# Patient Record
Sex: Female | Born: 1935 | Race: White | Hispanic: No | Smoking: Current every day smoker
Health system: Southern US, Community
[De-identification: ages and names within clinical notes are randomized; demographics above are authoritative.]

## PROBLEM LIST (undated history)

## (undated) DIAGNOSIS — C801 Malignant (primary) neoplasm, unspecified: Secondary | ICD-10-CM

## (undated) DIAGNOSIS — I10 Essential (primary) hypertension: Secondary | ICD-10-CM

## (undated) HISTORY — PX: ABDOMINAL SURGERY: SHX537

## (undated) HISTORY — PX: HERNIA REPAIR: SHX51

## (undated) HISTORY — PX: BREAST LUMPECTOMY: SHX2

## (undated) HISTORY — PX: ABDOMINAL HYSTERECTOMY: SHX81

---

## 2013-10-07 ENCOUNTER — Emergency Department (HOSPITAL_BASED_OUTPATIENT_CLINIC_OR_DEPARTMENT_OTHER): Payer: Medicare Other

## 2013-10-07 ENCOUNTER — Encounter (HOSPITAL_BASED_OUTPATIENT_CLINIC_OR_DEPARTMENT_OTHER): Payer: Self-pay | Admitting: Emergency Medicine

## 2013-10-07 ENCOUNTER — Emergency Department (HOSPITAL_BASED_OUTPATIENT_CLINIC_OR_DEPARTMENT_OTHER)
Admission: EM | Admit: 2013-10-07 | Discharge: 2013-10-07 | Disposition: A | Discharge status: Home / Self Care | Payer: Medicare Other | Attending: Emergency Medicine | Admitting: Emergency Medicine

## 2013-10-07 DIAGNOSIS — Y9389 Activity, other specified: Secondary | ICD-10-CM | POA: Insufficient documentation

## 2013-10-07 DIAGNOSIS — S9030XA Contusion of unspecified foot, initial encounter: Secondary | ICD-10-CM | POA: Insufficient documentation

## 2013-10-07 DIAGNOSIS — F17200 Nicotine dependence, unspecified, uncomplicated: Secondary | ICD-10-CM | POA: Insufficient documentation

## 2013-10-07 DIAGNOSIS — I10 Essential (primary) hypertension: Secondary | ICD-10-CM | POA: Insufficient documentation

## 2013-10-07 DIAGNOSIS — W108XXA Fall (on) (from) other stairs and steps, initial encounter: Secondary | ICD-10-CM | POA: Insufficient documentation

## 2013-10-07 DIAGNOSIS — Y9289 Other specified places as the place of occurrence of the external cause: Secondary | ICD-10-CM | POA: Insufficient documentation

## 2013-10-07 DIAGNOSIS — S9032XA Contusion of left foot, initial encounter: Secondary | ICD-10-CM

## 2013-10-07 DIAGNOSIS — Z7982 Long term (current) use of aspirin: Secondary | ICD-10-CM | POA: Insufficient documentation

## 2013-10-07 DIAGNOSIS — Z859 Personal history of malignant neoplasm, unspecified: Secondary | ICD-10-CM | POA: Insufficient documentation

## 2013-10-07 HISTORY — DX: Malignant (primary) neoplasm, unspecified: C80.1

## 2013-10-07 HISTORY — DX: Essential (primary) hypertension: I10

## 2013-10-07 MED ORDER — OXYCODONE-ACETAMINOPHEN 5-325 MG PO TABS: 1.0000 | ORAL_TABLET | Freq: Three times a day (TID) | ORAL | Status: DC | PRN

## 2013-10-07 NOTE — ED Notes (Signed)
Pt states she slipped down her stairs causing her left foot to bend back. Bruising noted to left dorsal area of foot along with a large area of swelling. Pain with ambulation. No other injury

## 2013-10-07 NOTE — ED Provider Notes (Signed)
CSN: [id number]     Arrival date & time 10/07/13  0118 History   First MD Initiated Contact with Patient 10/07/13 0150     Chief Complaint  Patient presents with  . Fall      Patient is a 80 y.o. female presenting with foot injury. The history is provided by the patient.  Foot Injury Location:  Foot Foot location:  L foot Pain details:    Quality:  Aching   Radiates to:  Does not radiate   Severity:  Mild   Onset quality:  Sudden   Timing:  Constant   Progression:  Worsening Chronicity:  New Relieved by:  Rest Worsened by:  Bearing weight Associated symptoms: no back pain and no neck pain   pt slipped on stairs just prior to arrival She reports during fall her left foot "bended back" No head injury No other injury reported She denies ankle pain  Past Medical History  Diagnosis Date  . Hypertension   . Cancer    Past Surgical History  Procedure Laterality Date  . Abdominal hysterectomy    . Hernia repair    . Breast lumpectomy     No family history on file. History  Substance Use Topics  . Smoking status: Current Every Day Smoker  . Smokeless tobacco: Not on file  . Alcohol Use: No   OB History   Grav Para Term Preterm Abortions TAB SAB Ect Mult Living                 Review of Systems  Musculoskeletal: Negative for back pain and neck pain.  Neurological: Negative for weakness.      Allergies  Review of patient's allergies indicates not on file.  Home Medications   Prior to Admission medications   Medication Sig Start Date End Date Taking? Authorizing Provider  aspirin EC 81 MG tablet Take 81 mg by mouth daily.   Yes Historical Provider, MD   BP 203/80  Pulse 86  Temp(Src) 98.4 F (36.9 C) (Oral)  Resp 20  Ht 4\' 11"  (1.499 m)  Wt 129 lb (58.514 kg)  BMI 26.04 kg/m2  SpO2 99% Physical Exam CONSTITUTIONAL: Well developed/well nourished HEAD: Normocephalic/atraumatic EYES: EOMI/PERRL ENMT: Mucous membranes moist NECK: supple no meningeal  signs SPINE:entire spine nontender CV: S1/S2 noted ABDOMEN: soft, nontender, no rebound or guarding NEURO: Pt is awake/alert, moves all extremitiesx4 EXTREMITIES: pulses normal, full ROM. Bruising/edema/tenderness noted to dorsal aspect of left foot.  No crepitus/laceration noted.  There is no left ankle tenderness.  No tenderness with palpation/range of motion of left hip/knee All other extremities/joints palpated/ranged and nontender SKIN: warm, color normal PSYCH: no abnormalities of mood noted  ED Course  Procedures 2:08 AM Pt declines pain meds at this time 2:42 AM Pt requests post op shoe Stable for d/c home  Imaging Review Dg Foot Complete Left  10/07/2013   CLINICAL DATA:  Fall, swelling.  EXAM: LEFT FOOT - COMPLETE 3+ VIEW  COMPARISON:  None.  FINDINGS: No acute fracture deformity. No dislocation. Mild first metatarsophalangeal osteoarthrosis. Os perineum. No destructive bony lesions. Dorsal mid to forefoot soft tissue swelling without subcutaneous gas or radiopaque foreign bodies.  IMPRESSION: Dorsal mid to forefoot soft tissue swelling without acute fracture deformity or dislocation.   Electronically Signed   By: Elon Alas   On: 10/07/2013 02:25     MDM   Final diagnoses:  Contusion of left foot, initial encounter    Nursing notes including past medical history  and social history reviewed and considered in documentation xrays reviewed and considered     Sharyon Cable, MD 10/07/13 [phone]

## 2013-10-07 NOTE — Discharge Instructions (Signed)

## 2013-10-19 ENCOUNTER — Encounter (HOSPITAL_BASED_OUTPATIENT_CLINIC_OR_DEPARTMENT_OTHER): Payer: Self-pay | Admitting: Emergency Medicine

## 2013-10-19 ENCOUNTER — Emergency Department (HOSPITAL_BASED_OUTPATIENT_CLINIC_OR_DEPARTMENT_OTHER): Payer: Medicare Other

## 2013-10-19 ENCOUNTER — Emergency Department (HOSPITAL_BASED_OUTPATIENT_CLINIC_OR_DEPARTMENT_OTHER)
Admission: EM | Admit: 2013-10-19 | Discharge: 2013-10-19 | Disposition: A | Discharge status: Home / Self Care | Payer: Medicare Other | Attending: Emergency Medicine | Admitting: Emergency Medicine

## 2013-10-19 DIAGNOSIS — S92242A Displaced fracture of medial cuneiform of left foot, initial encounter for closed fracture: Secondary | ICD-10-CM

## 2013-10-19 DIAGNOSIS — S99919A Unspecified injury of unspecified ankle, initial encounter: Secondary | ICD-10-CM

## 2013-10-19 DIAGNOSIS — F17200 Nicotine dependence, unspecified, uncomplicated: Secondary | ICD-10-CM | POA: Diagnosis not present

## 2013-10-19 DIAGNOSIS — Z859 Personal history of malignant neoplasm, unspecified: Secondary | ICD-10-CM | POA: Diagnosis not present

## 2013-10-19 DIAGNOSIS — Z7982 Long term (current) use of aspirin: Secondary | ICD-10-CM | POA: Insufficient documentation

## 2013-10-19 DIAGNOSIS — S8990XA Unspecified injury of unspecified lower leg, initial encounter: Secondary | ICD-10-CM | POA: Diagnosis present

## 2013-10-19 DIAGNOSIS — S92312D Displaced fracture of first metatarsal bone, left foot, subsequent encounter for fracture with routine healing: Secondary | ICD-10-CM

## 2013-10-19 DIAGNOSIS — IMO0002 Reserved for concepts with insufficient information to code with codable children: Secondary | ICD-10-CM | POA: Insufficient documentation

## 2013-10-19 DIAGNOSIS — Y9389 Activity, other specified: Secondary | ICD-10-CM | POA: Diagnosis not present

## 2013-10-19 DIAGNOSIS — Y929 Unspecified place or not applicable: Secondary | ICD-10-CM | POA: Insufficient documentation

## 2013-10-19 DIAGNOSIS — I10 Essential (primary) hypertension: Secondary | ICD-10-CM | POA: Diagnosis not present

## 2013-10-19 DIAGNOSIS — X500XXA Overexertion from strenuous movement or load, initial encounter: Secondary | ICD-10-CM | POA: Insufficient documentation

## 2013-10-19 DIAGNOSIS — S99929A Unspecified injury of unspecified foot, initial encounter: Secondary | ICD-10-CM

## 2013-10-19 DIAGNOSIS — IMO0001 Reserved for inherently not codable concepts without codable children: Secondary | ICD-10-CM | POA: Diagnosis not present

## 2013-10-19 MED ORDER — LISINOPRIL 10 MG PO TABS: 10.0000 mg | ORAL_TABLET | Freq: Every day | ORAL | Status: DC

## 2013-10-19 MED ORDER — OXYCODONE-ACETAMINOPHEN 5-325 MG PO TABS
1.0000 | ORAL_TABLET | Freq: Once | ORAL | Status: AC
  Administered 2013-10-19: 1 via ORAL
  Filled 2013-10-19: qty 1

## 2013-10-19 MED ORDER — OXYCODONE-ACETAMINOPHEN 5-325 MG PO TABS: 1.0000 | ORAL_TABLET | ORAL | Status: DC | PRN

## 2013-10-19 NOTE — Discharge Instructions (Signed)
Cuneiform Fracture, Simple Cuneiform Fracture (simple) is a break (fracture) of one of your cuneiform bones. This is one of the bones located in the middle of your foot. When fractures are small and not out of place, they may be treated conservatively. This means that only a cast is needed for treatment. At first, no walking may be allowed, but following a period of time, your caregiver may allow you to have a walking cast. DIAGNOSIS  The diagnosis of a fractured cuneiform is made by X-ray. X-rays may be required before and after the injury is put into a splint or cast. HOME CARE INSTRUCTIONS   Only take over-the-counter or prescription medicines for pain, discomfort or fever as directed by your caregiver.  If you have a splint held on with an elastic wrap and your foot or toes become numb or cold and blue, loosen the wrap and reapply more loosely. See your caregiver if there is no relief.  Use your injured foot as directed. Do not drive a vehicle until your caregiver specifically tells you it is safe to do so.  WARNING: See your caregiver as directed. It is important to keep all follow-up appointments in order to provide the best opportunity for healing and to avoid disability and chronic pain. SEEK IMMEDIATE MEDICAL CARE IF:   There is swelling or increasing pain in foot.  You begin to lose feeling in your foot or toes, or develop swelling of the foot or toes.  The foot or toes on the injured side are blue or cold.  You develop pain, which is not controlled by the medications. MAKE SURE YOU:   Understand these instructions.  Will watch your condition.  Will get help right away if you are not doing well or get worse. Document Released: 12/04/2001 Document Revised: 06/06/2011 Document Reviewed: 10/18/2007 [facility] Patient Information 2015 [place], [place]. This information is not intended to replace advice given to you by your health care provider. Make sure you discuss any questions you  have with your health care provider. Hypertension Hypertension, commonly called high blood pressure, is when the force of blood pumping through your arteries is too strong. Your arteries are the blood vessels that carry blood from your heart throughout your body. A blood pressure reading consists of a higher number over a lower number, such as 110/72. The higher number (systolic) is the pressure inside your arteries when your heart pumps. The lower number (diastolic) is the pressure inside your arteries when your heart relaxes. Ideally you want your blood pressure below 120/80. Hypertension forces your heart to work harder to pump blood. Your arteries may become narrow or stiff. Having hypertension puts you at risk for heart disease, stroke, and other problems.  RISK FACTORS Some risk factors for high blood pressure are controllable. Others are not.  Risk factors you cannot control include:   Race. You may be at higher risk if you are African American.  Age. Risk increases with age.  Gender. Men are at higher risk than women before age 54 years. After age 58, women are at higher risk than men. Risk factors you can control include:  Not getting enough exercise or physical activity.  Being overweight.  Getting too much fat, sugar, calories, or salt in your diet.  Drinking too much alcohol. SIGNS AND SYMPTOMS Hypertension does not usually cause signs or symptoms. Extremely high blood pressure (hypertensive crisis) may cause headache, anxiety, shortness of breath, and nosebleed. DIAGNOSIS  To check if you have hypertension, your  health care provider will measure your blood pressure while you are seated, with your arm held at the level of your heart. It should be measured at least twice using the same arm. Certain conditions can cause a difference in blood pressure between your right and left arms. A blood pressure reading that is higher than normal on one occasion does not mean that you need  treatment. If one blood pressure reading is high, ask your health care provider about having it checked again. TREATMENT  Treating high blood pressure includes making lifestyle changes and possibly taking medicine. Living a healthy lifestyle can help lower high blood pressure. You may need to change some of your habits. Lifestyle changes may include:  Following the DASH diet. This diet is high in fruits, vegetables, and whole grains. It is low in salt, red meat, and added sugars.  Getting at least 2 hours of brisk physical activity every week.  Losing weight if necessary.  Not smoking.  Limiting alcoholic beverages.  Learning ways to reduce stress. If lifestyle changes are not enough to get your blood pressure under control, your health care provider may prescribe medicine. You may need to take more than one. Work closely with your health care provider to understand the risks and benefits. HOME CARE INSTRUCTIONS  Have your blood pressure rechecked as directed by your health care provider.   Take medicines only as directed by your health care provider. Follow the directions carefully. Blood pressure medicines must be taken as prescribed. The medicine does not work as well when you skip doses. Skipping doses also puts you at risk for problems.   Do not smoke.   Monitor your blood pressure at home as directed by your health care provider. SEEK MEDICAL CARE IF:   You think you are having a reaction to medicines taken.  You have recurrent headaches or feel dizzy.  You have swelling in your ankles.  You have trouble with your vision. SEEK IMMEDIATE MEDICAL CARE IF:  You develop a severe headache or confusion.  You have unusual weakness, numbness, or feel faint.  You have severe chest or abdominal pain.  You vomit repeatedly.  You have trouble breathing. MAKE SURE YOU:   Understand these instructions.  Will watch your condition.  Will get help right away if you are  not doing well or get worse. Document Released: 03/14/2005 Document Revised: 07/29/2013 Document Reviewed: 01/04/2013 [facility] Patient Information 2015 [place], [place]. This information is not intended to replace advice given to you by your health care provider. Make sure you discuss any questions you have with your health care provider.   Emergency Department Resource Guide 1) Find a Doctor and Pay Out of Pocket Although you won't have to find out who is covered by your insurance plan, it is a good idea to ask around and get recommendations. You will then need to call the office and see if the doctor you have chosen will accept you as a new patient and what types of options they offer for patients who are self-pay. Some doctors offer discounts or will set up payment plans for their patients who do not have insurance, but you will need to ask so you aren't surprised when you get to your appointment.  2) Contact Your Local Health Department Not all health departments have doctors that can see patients for sick visits, but many do, so it is worth a call to see if yours does. If you don't know where your local health department  is, you can check in your phone book. The CDC also has a tool to help you locate your state's health department, and many state websites also have listings of all of their local health departments.  3) Find a [facility] If your illness is not likely to be very severe or complicated, you may want to try a walk in clinic. These are popping up all over the country in pharmacies, drugstores, and shopping centers. They're usually staffed by nurse practitioners or physician assistants that have been trained to treat common illnesses and complaints. They're usually fairly quick and inexpensive. However, if you have serious medical issues or chronic medical problems, these are probably not your best option.  No Primary Care Doctor: - Call Health Connect at  [phone] - they can help  you locate a primary care doctor that  accepts your insurance, provides certain services, etc. - Physician Referral Service- [phone]  Chronic Pain Problems: Organization         Address  Phone   Notes  [facility]  [phone] Patients need to be referred by their primary care doctor.   Medication Assistance: Organization         Address  Phone   Notes  [facility] Medication [facility] [address] [place], [place] [address] [phone] --Must be a resident of [facility] -- Must have NO insurance coverage whatsoever (no Medicaid/ Medicare, etc.) -- The pt. MUST have a primary care doctor that directs their care regularly and follows them in the community   MedAssist  [phone]   [employer]  [phone]    Agencies that provide inexpensive medical care: Organization         Address  Phone   Notes  [place]  [phone]   [facility]    [phone]   [facility] [place], [place] [address] [phone]   [place] [address], [place] [phone]   Planned Parenthood    [phone]   [facility]    [phone]   [place] and [place] [address], [place] Phone:  [phone], Fax:  [phone] Hours of Operation:  9 am - 6 pm, M-F.  Also accepts Medicaid/Medicare and self-pay.  [facility] for [place], [address], [place] Phone: [phone], Fax: [phone]. Hours of Operation:  8:30 am - 5:30 pm, M-F.  Also accepts Medicaid and self-pay.  [facility] [address] [place] Phone: [phone]   [place], [place], [place] [phone], Ext. [phone] Mondays & Thursdays: 7-9 AM.  First 15 patients are seen on a first come, first serve basis.    [place] Providers:  Organization         Address  Phone   Notes  [facility] [address], [place] [phone] Also accepts self-pay patients.  [place], [place]  [phone]   [place], [address], [place] [phone]   [facility] [address] [place] [phone]   Lucianne Lei [address], [place]   [phone] Only accepts [place] Access [place] patients after they have their  name applied to their card.   Self-Pay (no insurance) in [facility]:  Organization         Address  Phone   Notes  Sickle Cell Patients, [facility] [place] [phone]   [facility] [place] [phone]   [facility] [place]  [place], [place], [place] [phone]   [facility]/Dr. Osei-Bonsu  [address], [place] or [address], [place] [phone] Phone number for both [place] and [place] locations is the same.  [facility] [address], [place] [phone]   [facility] [address] [place] or [address] [phone]   [facility] [address], [place] [phone], phone; [phone], fax Sees patients 1st and 3rd Saturday of every month.  Must not qualify for public or private insurance (i.e. Medicaid, Medicare, [place] Health Choice, Veterans' Benefits)  Household income should be no more than 200% of the poverty level The clinic cannot treat you if you are pregnant or think you are pregnant  Sexually transmitted diseases are not treated at the clinic.    Dental Care: Organization         Address  Phone  Notes  [facility] Department of [facility] [place] [phone] Accepts children up to age 10 who are enrolled in [place] or [place]; pregnant women with a Medicaid card; and children who have applied for Medicaid or [place] Health Choice, but were declined, whose parents can pay a reduced fee at time of service.  [facility] Department of [facility]  [address], [place] [phone] Accepts children up to age 32 who are enrolled in [place] or [place]; pregnant women with a Medicaid card; and children who have applied for Medicaid or [place] Health Choice, but were declined, whose parents can pay a reduced fee at time of service.  [place] Adult Dental Access PROGRAM  [place] [phone] Patients are seen by appointment only. Walk-ins are not accepted. [place] will see patients 34 years of age and older. Monday - Tuesday (8am-5pm) Most Wednesdays (8:30-5pm) $30 per visit, cash only  [facility] Adult Dental Access PROGRAM  [address], [facility] [phone] Patients are seen by appointment only. Walk-ins are not accepted. [place] will see patients 22 years of age and older. One Wednesday Evening (Monthly: Volunteer Based).  $30 per visit, cash only  [place]  [phone] for adults; Children under age 76, call Graduate Pediatric Dentistry at [phone]. Children aged 53-14, please call [phone] to request a pediatric application.  Dental services are provided in all areas of dental care including fillings, crowns and bridges, complete and partial dentures, implants, gum treatment, root canals, and extractions. Preventive care is also provided. Treatment is provided to both adults and children. Patients are selected via a lottery and there is often a waiting list.   [facility] [address], [place]  [phone] [email or web address], [place], [place] [phone], Ext. [phone] Second and Fourth Thursday of each month, opens at 6:30 AM; Clinic ends at 9 AM.  Patients are seen on a  first-come first-served basis, and a limited number are seen during each clinic.   [facility]  [address] [place], [place] [phone]   Eligibility Requirements You must have lived in [place], [place], or [place] counties for at least the last three months.   You cannot be eligible for state or federal sponsored [employer], including [employer], [place], or [employer].   You generally cannot be eligible for healthcare insurance through your employer.    How to apply: Eligibility screenings are held every Tuesday and Wednesday afternoon from 1:00 pm until 4:00 pm. You do not need an appointment for the interview!  [facility] [address] [place], [place]   [place]  [place] Department  [place]  [phone]    Behavioral Health Resources in the Community: Intensive Outpatient Programs Organization         Address  Phone  Notes  [place]. [address] [place], [place] [phone]   [facility] [address] [place], [place]   ADS: Alcohol & Drug Svcs [address] [place], [place]   [place] [address]  [place], [place] or [phone]   Substance Abuse Resources Organization         Address  Phone  Notes  Alcohol and Drug Services  [phone]   [place]  [phone]   [facility] [place]   Chinita Pester  [phone]   Residential & Outpatient Substance Abuse Program  [phone]   Psychological Services Organization         Address  Phone  Notes  [facility] [place]  [place]  [phone]   [place] [address], [place] or [phone]    Mobile Crisis Teams Organization         Address  Phone  Notes  [facility]rapeutic Alternatives, Mobile Crisis Care Unit  [phone]   Assertive Psychotherapeutic Services  [address] [place], [place]   [place] Levels [address] [place] [phone]    Self-Help/Support Groups Organization         Address  Phone             Notes  [place]. of [place] - variety of support groups  Guy Call for more information  Narcotics Anonymous (NA), [facility] [address], [employer] [place]  2 meetings at this location   Special educational needs teacher         Address  Phone  Notes  [facility] Residential Treatment [place],    [place]  [phone]   [facility]  [address], [place] [address], [place], [place]   [place], [place] [phone] Admissions: 8am-3pm M-F  Incentives Substance [place] [address]    [place], [place] [phone]   [facility] [address] Jadene Pierini [place], [place]   [facility] [address]  [place], [place]   [facility] Programs - Intensive Outpatient [address], [place], [place]   [facility] ([place].) [place].,  [place], [place] or [phone]   [facility]idential Treatment Services (RTS) [address] [place], [place] Accepts  Medicaid  [facility] [address],  [place] [phone] Substance Abuse/Addiction Treatment   [facility] Organization         Address  Phone  Notes  [facility]  [phone]   Domenic Schwab, PhD [address] [place], [place]   [phone] or  [phone]   [place], [place] [phone]   [place] [address], [place], [place] [phone] Insurance/Medicaid/sponsorship through [facility] and Families [address] [place]                                    [place], [place] [phone] [place] [address], [place] [phone]    Dr. Adele Schilder  [phone]   [facility] Dept. 1) [address], [place] 2) [place] 3)  [address], [place] [phone]  [phone]   [place] [phone] or [phone] (After Hours)

## 2013-10-19 NOTE — ED Notes (Signed)
Pt c/o lt foot pain with swelling and bruising x2wks ago d/t a fall, states stepped wrong tonight and now pain/swelling/bruising again, states can't bare weight

## 2013-10-19 NOTE — ED Provider Notes (Signed)
CSN: [id number]     Arrival date & time 10/19/13  0024 History   First MD Initiated Contact with Patient 10/19/13 0033     Chief Complaint  Patient presents with  . Foot Injury     (Consider location/radiation/quality/duration/timing/severity/associated sxs/prior Treatment) HPI Patient with previous injury to her left foot 2 weeks ago after a fall. Has been ambulating. States she "stepped wrong" this evening with immediate pain to her left midfoot. She's had swelling at the site. She's been unable to and leg on the foot. She took Percocet with little relief. No other injury. Denies any head or neck trauma.  Patient states she has a previous history of hypertension but has not been on medication for several years. She does not remember the name of the medication she was on. She denies any headache or dizziness. No chest pain or shortness of breath. Past Medical History  Diagnosis Date  . Hypertension   . Cancer    Past Surgical History  Procedure Laterality Date  . Abdominal hysterectomy    . Hernia repair    . Breast lumpectomy     No family history on file. History  Substance Use Topics  . Smoking status: Current Every Day Smoker  . Smokeless tobacco: Not on file  . Alcohol Use: No   OB History   Grav Para Term Preterm Abortions TAB SAB Ect Mult Living                 Review of Systems  Constitutional: Negative for fever and chills.  Respiratory: Negative for shortness of breath.   Cardiovascular: Negative for chest pain.  Gastrointestinal: Negative for nausea, vomiting and abdominal pain.  Musculoskeletal: Positive for arthralgias and joint swelling. Negative for back pain, neck pain and neck stiffness.  Skin: Negative for rash.  Neurological: Negative for dizziness, weakness, light-headedness, numbness and headaches.  All other systems reviewed and are negative.     Allergies  Review of patient's allergies indicates no known allergies.  Home Medications    Prior to Admission medications   Medication Sig Start Date End Date Taking? Authorizing Provider  aspirin EC 81 MG tablet Take 81 mg by mouth daily.    Historical Provider, MD  oxyCODONE-acetaminophen (PERCOCET/ROXICET) 5-325 MG per tablet Take 1 tablet by mouth every 8 (eight) hours as needed for severe pain. 10/07/13   Sharyon Cable, MD   BP 218/80  Pulse 75  Temp(Src) 98.4 F (36.9 C) (Oral)  Resp 16  Ht 4\' 11"  (1.499 m)  Wt 129 lb (58.514 kg)  BMI 26.04 kg/m2  SpO2 96% Physical Exam  Nursing note and vitals reviewed. Constitutional: She is oriented to person, place, and time. She appears well-developed and well-nourished. No distress.  HENT:  Head: Normocephalic and atraumatic.  Eyes: EOM are normal. Pupils are equal, round, and reactive to light.  Neck: Normal range of motion. Neck supple.  No posterior midline cervical tenderness to palpation.  Cardiovascular: Normal rate and regular rhythm.   Pulmonary/Chest: Effort normal and breath sounds normal.  Abdominal: Soft. Bowel sounds are normal.  Musculoskeletal: Normal range of motion. She exhibits tenderness (patient with swelling to the distal second and third metatarsal of the left midfoot. Tender the site. There is some old bruising at the base of her toes.). She exhibits no edema.  No ankle pain over the lateral or medial malleoli. Proximal fibular tenderness. Good capillary refill.  Neurological: She is alert and oriented to person, place, and time.  Moves  all joints with good strength. Sensation grossly intact.  Skin: Skin is warm and dry. No rash noted. No erythema.  Psychiatric: She has a normal mood and affect. Her behavior is normal.    ED Course  Procedures (including critical care time) Labs Review Labs Reviewed - No data to display  Imaging Review No results found.   EKG Interpretation None      MDM   Final diagnoses:  None    Patient placed in cam boot and given crutches. She'll need to  followup with orthopedist in a week. She's been advised that she needs to find prior Dr. to manage her high blood pressure. Will start on low-dose lisinopril.    Julianne Rice, MD 10/19/13 [phone]

## 2015-06-01 ENCOUNTER — Encounter (HOSPITAL_BASED_OUTPATIENT_CLINIC_OR_DEPARTMENT_OTHER): Payer: Self-pay | Admitting: Emergency Medicine

## 2015-06-01 ENCOUNTER — Emergency Department (HOSPITAL_BASED_OUTPATIENT_CLINIC_OR_DEPARTMENT_OTHER): Payer: Medicare Other

## 2015-06-01 ENCOUNTER — Emergency Department (HOSPITAL_BASED_OUTPATIENT_CLINIC_OR_DEPARTMENT_OTHER)
Admission: EM | Admit: 2015-06-01 | Discharge: 2015-06-01 | Disposition: A | Discharge status: Home / Self Care | Payer: Medicare Other | Attending: Emergency Medicine | Admitting: Emergency Medicine

## 2015-06-01 DIAGNOSIS — I10 Essential (primary) hypertension: Secondary | ICD-10-CM | POA: Diagnosis not present

## 2015-06-01 DIAGNOSIS — N23 Unspecified renal colic: Secondary | ICD-10-CM

## 2015-06-01 DIAGNOSIS — Z79899 Other long term (current) drug therapy: Secondary | ICD-10-CM | POA: Diagnosis not present

## 2015-06-01 DIAGNOSIS — Z7982 Long term (current) use of aspirin: Secondary | ICD-10-CM | POA: Diagnosis not present

## 2015-06-01 DIAGNOSIS — Z859 Personal history of malignant neoplasm, unspecified: Secondary | ICD-10-CM | POA: Diagnosis not present

## 2015-06-01 DIAGNOSIS — F17200 Nicotine dependence, unspecified, uncomplicated: Secondary | ICD-10-CM | POA: Diagnosis not present

## 2015-06-01 DIAGNOSIS — R109 Unspecified abdominal pain: Secondary | ICD-10-CM | POA: Diagnosis present

## 2015-06-01 LAB — COMPREHENSIVE METABOLIC PANEL
ALBUMIN: 3.6 g/dL (ref 3.5–5.0)
ALK PHOS: 58 U/L (ref 38–126)
ALT: 12 U/L — AB (ref 14–54)
AST: 21 U/L (ref 15–41)
Anion gap: 8 (ref 5–15)
BILIRUBIN TOTAL: 0.5 mg/dL (ref 0.3–1.2)
BUN: 18 mg/dL (ref 6–20)
CALCIUM: 8.8 mg/dL — AB (ref 8.9–10.3)
CO2: 24 mmol/L (ref 22–32)
Chloride: 105 mmol/L (ref 101–111)
Creatinine, Ser: 1.06 mg/dL — ABNORMAL HIGH (ref 0.44–1.00)
GFR calc Af Amer: 56 mL/min — ABNORMAL LOW (ref >60)
GFR calc non Af Amer: 48 mL/min — ABNORMAL LOW (ref >60)
GLUCOSE: 129 mg/dL — AB (ref 65–99)
Potassium: 4 mmol/L (ref 3.5–5.1)
Sodium: 137 mmol/L (ref 135–145)
Total Protein: 8.2 g/dL — ABNORMAL HIGH (ref 6.5–8.1)

## 2015-06-01 LAB — CBC WITH DIFFERENTIAL/PLATELET
Basophils Absolute: 0 10*3/uL (ref 0.0–0.1)
Basophils Relative: 0 %
EOS ABS: 0 10*3/uL (ref 0.0–0.7)
Eosinophils Relative: 0 %
HCT: 38.6 % (ref 36.0–46.0)
HEMOGLOBIN: 13.1 g/dL (ref 12.0–15.0)
Lymphocytes Relative: 12 %
Lymphs Abs: 1.3 10*3/uL (ref 0.7–4.0)
MCH: 31.3 pg (ref 26.0–34.0)
MCHC: 33.9 g/dL (ref 30.0–36.0)
MCV: 92.1 fL (ref 78.0–100.0)
MONOS PCT: 4 %
Monocytes Absolute: 0.4 10*3/uL (ref 0.1–1.0)
NEUTROS ABS: 9.3 10*3/uL — AB (ref 1.7–7.7)
Neutrophils Relative %: 84 %
Platelets: 227 10*3/uL (ref 150–400)
RBC: 4.19 MIL/uL (ref 3.87–5.11)
RDW: 14 % (ref 11.5–15.5)
WBC: 11 10*3/uL — ABNORMAL HIGH (ref 4.0–10.5)

## 2015-06-01 LAB — URINALYSIS, ROUTINE W REFLEX MICROSCOPIC
BILIRUBIN URINE: NEGATIVE
Glucose, UA: NEGATIVE mg/dL
Hgb urine dipstick: NEGATIVE
Ketones, ur: NEGATIVE mg/dL
Leukocytes, UA: NEGATIVE
Nitrite: NEGATIVE
Protein, ur: 30 mg/dL — AB
SPECIFIC GRAVITY, URINE: 1.008 (ref 1.005–1.030)
pH: 7.5 (ref 5.0–8.0)

## 2015-06-01 LAB — URINE MICROSCOPIC-ADD ON

## 2015-06-01 LAB — LIPASE, BLOOD: Lipase: 42 U/L (ref 11–51)

## 2015-06-01 MED ORDER — KETOROLAC TROMETHAMINE 15 MG/ML IJ SOLN
15.0000 mg | Freq: Once | INTRAMUSCULAR | Status: AC
  Administered 2015-06-01: 15 mg via INTRAVENOUS
  Filled 2015-06-01: qty 1

## 2015-06-01 MED ORDER — ONDANSETRON HCL 4 MG PO TABS: 4.0000 mg | ORAL_TABLET | Freq: Four times a day (QID) | ORAL | Status: DC

## 2015-06-01 MED ORDER — ONDANSETRON HCL 4 MG/2ML IJ SOLN
4.0000 mg | Freq: Once | INTRAMUSCULAR | Status: AC
  Administered 2015-06-01: 4 mg via INTRAVENOUS
  Filled 2015-06-01: qty 2

## 2015-06-01 MED ORDER — MORPHINE SULFATE (PF) 4 MG/ML IV SOLN
4.0000 mg | Freq: Once | INTRAVENOUS | Status: AC
  Administered 2015-06-01: 4 mg via INTRAVENOUS
  Filled 2015-06-01: qty 1

## 2015-06-01 MED ORDER — SODIUM CHLORIDE 0.9 % IV SOLN: INTRAVENOUS | Status: DC

## 2015-06-01 MED ORDER — TAMSULOSIN HCL 0.4 MG PO CAPS: 0.4000 mg | ORAL_CAPSULE | Freq: Every day | ORAL | Status: DC

## 2015-06-01 MED ORDER — HYDROCODONE-ACETAMINOPHEN 5-325 MG PO TABS: 1.0000 | ORAL_TABLET | ORAL | Status: DC | PRN

## 2015-06-01 MED ORDER — SODIUM CHLORIDE 0.9 % IV BOLUS (SEPSIS)
1000.0000 mL | Freq: Once | INTRAVENOUS | Status: AC
  Administered 2015-06-01: 1000 mL via INTRAVENOUS

## 2015-06-01 MED FILL — ONDANSETRON HCL 4 MG TABLET: 4 | 4 days supply | Qty: 12 | Fill #0

## 2015-06-01 MED FILL — TAMSULOSIN HCL 0.4 MG CAP: 0.4 | 10 days supply | Qty: 10 | Fill #0

## 2015-06-01 MED FILL — HYDROCODON-APAP 5-325: 5-325 | 2 days supply | Qty: 10 | Fill #0

## 2015-06-01 NOTE — ED Notes (Signed)
MD at bedside. 

## 2015-06-01 NOTE — ED Notes (Addendum)
Patient transported to CT 

## 2015-06-01 NOTE — ED Provider Notes (Signed)
CSN: [id number]     Arrival date & time 06/01/15  0622 History   First MD Initiated Contact with Patient 06/01/15 [phone]     Chief Complaint  Patient presents with  . Flank Pain     (Consider location/radiation/quality/duration/timing/severity/associated sxs/prior Treatment) HPI Comments: Patient awoke around 1230 with L flank pain that has moved to lower abdomen.  Pain is constant but waxing and waning in severity. Associated with nausea and several episodes of vomiting, about 4 or 5 overnight.  No diarrhea.  Denies dysuria or hematuria.  No fever.  No history of kidney stones but feels that is what it is. No chest pain or SOB.  Patient is a 82 y.o. female presenting with flank pain. The history is provided by the patient.  Flank Pain Associated symptoms include abdominal pain. Pertinent negatives include no chest pain, no headaches and no shortness of breath.    Past Medical History  Diagnosis Date  . Hypertension   . Cancer [facility])    Past Surgical History  Procedure Laterality Date  . Abdominal hysterectomy    . Hernia repair    . Breast lumpectomy     No family history on file. Social History  Substance Use Topics  . Smoking status: Current Every Day Smoker  . Smokeless tobacco: None  . Alcohol Use: No   OB History    No data available     Review of Systems  Constitutional: Positive for activity change. Negative for fever and fatigue.  HENT: Negative for congestion and rhinorrhea.   Respiratory: Negative for cough, chest tightness and shortness of breath.   Cardiovascular: Negative for chest pain.  Gastrointestinal: Positive for nausea, vomiting and abdominal pain.  Genitourinary: Positive for dysuria, flank pain and difficulty urinating. Negative for hematuria, vaginal bleeding and vaginal discharge.  Musculoskeletal: Positive for back pain. Negative for myalgias and arthralgias.  Skin: Negative for rash.  Neurological: Negative for weakness, light-headedness and  headaches.  A complete 10 system review of systems was obtained and all systems are negative except as noted in the HPI and PMH.      Allergies  Review of patient's allergies indicates no known allergies.  Home Medications   Prior to Admission medications   Medication Sig Start Date End Date Taking? Authorizing Provider  aspirin EC 81 MG tablet Take 81 mg by mouth daily.    Historical Provider, MD  HYDROcodone-acetaminophen (NORCO/VICODIN) 5-325 MG tablet Take 1 tablet by mouth every 4 (four) hours as needed. 06/01/15   Ezequiel Essex, MD  lisinopril (PRINIVIL,ZESTRIL) 10 MG tablet Take 1 tablet (10 mg total) by mouth daily. 10/19/13   Julianne Rice, MD  ondansetron (ZOFRAN) 4 MG tablet Take 1 tablet (4 mg total) by mouth every 6 (six) hours. 06/01/15   Ezequiel Essex, MD  oxyCODONE-acetaminophen (PERCOCET) 5-325 MG per tablet Take 1 tablet by mouth every 4 (four) hours as needed for moderate pain or severe pain. 10/19/13   Julianne Rice, MD  oxyCODONE-acetaminophen (PERCOCET/ROXICET) 5-325 MG per tablet Take 1 tablet by mouth every 8 (eight) hours as needed for severe pain. 10/07/13   Ripley Fraise, MD  tamsulosin (FLOMAX) 0.4 MG CAPS capsule Take 1 capsule (0.4 mg total) by mouth daily. 06/01/15   Ezequiel Essex, MD   BP 210/74 mmHg  Pulse 72  Temp(Src) 97.5 F (36.4 C) (Oral)  Resp 18  Ht 4\' 11"  (1.499 m)  Wt 130 lb (58.968 kg)  BMI 26.24 kg/m2  SpO2 94% Physical Exam  Constitutional: She  is oriented to person, place, and time. She appears well-developed and well-nourished. She appears distressed.  uncomfortable  HENT:  Head: Normocephalic and atraumatic.  Mouth/Throat: Oropharynx is clear and moist. No oropharyngeal exudate.  Eyes: Conjunctivae and EOM are normal. Pupils are equal, round, and reactive to light.  Neck: Normal range of motion. Neck supple.  No meningismus.  Cardiovascular: Normal rate, regular rhythm, normal heart sounds and intact distal pulses.   No murmur  heard. Pulmonary/Chest: Effort normal and breath sounds normal. No respiratory distress.  Abdominal: Soft. There is tenderness. There is no rebound and no guarding.  TTP LUQ and LLQ. No guarding or rebound  Musculoskeletal: Normal range of motion. She exhibits no edema or tenderness.  No CVAT  Neurological: She is alert and oriented to person, place, and time. No cranial nerve deficit. She exhibits normal muscle tone. Coordination normal.  No ataxia on finger to nose bilaterally. No pronator drift. 5/5 strength throughout. CN 2-12 intact.Equal grip strength. Sensation intact.   Skin: Skin is warm.  Psychiatric: She has a normal mood and affect. Her behavior is normal.  Nursing note and vitals reviewed.   ED Course  Procedures (including critical care time) Labs Review Labs Reviewed  URINALYSIS, ROUTINE W REFLEX MICROSCOPIC (NOT AT [facility]) - Abnormal; Notable for the following:    Protein, ur 30 (*)    All other components within normal limits  CBC WITH DIFFERENTIAL/PLATELET - Abnormal; Notable for the following:    WBC 11.0 (*)    Neutro Abs 9.3 (*)    All other components within normal limits  URINE MICROSCOPIC-ADD ON - Abnormal; Notable for the following:    Squamous Epithelial / LPF 0-5 (*)    Bacteria, UA RARE (*)    All other components within normal limits  COMPREHENSIVE METABOLIC PANEL - Abnormal; Notable for the following:    Glucose, Bld 129 (*)    Creatinine, Ser 1.06 (*)    Calcium 8.8 (*)    Total Protein 8.2 (*)    ALT 12 (*)    GFR calc non Af Amer 48 (*)    GFR calc Af Amer 56 (*)    All other components within normal limits  LIPASE, BLOOD    Imaging Review Ct Renal Stone Study  06/01/2015  CLINICAL DATA:  Left-sided flank pain for several hours EXAM: CT ABDOMEN AND PELVIS WITHOUT CONTRAST TECHNIQUE: Multidetector CT imaging of the abdomen and pelvis was performed following the standard protocol without IV contrast. COMPARISON:  None. FINDINGS: Lung bases are  free of acute infiltrate or sizable effusion. The gallbladder has been surgically removed. The liver, spleen, right adrenal gland and pancreas are within normal limits with the exception of multiple calcified splenic granulomas. The left adrenal gland demonstrates a the 1.7 cm hypodensity likely representing an adrenal adenoma. Kidneys are well visualized bilaterally. Nonobstructing stone is noted in the upper pole of the right kidney measuring approximately 5 mm. A right renal cyst is also noted. The left kidney demonstrates significant hydronephrosis and hydroureter which extends to the level of the ureterovesical junction on the left. A 2-3 mm stone is noted best visualized on image number 59 of series 4. The bladder is partially distended. Diverticulosis without evidence of diverticulitis is seen. The appendix has been surgically removed as has the uterus. No free pelvic fluid is noted. Aortoiliac calcifications are seen. No acute bony abnormality is noted. IMPRESSION: Left UVJ stone with obstructive changes as described. 5 mm nonobstructing right renal stone. Changes  of prior granulomatous disease. Right renal cystic change. Likely left adrenal adenoma Electronically Signed   By: Inez Catalina M.D.   On: 06/01/2015 07:30   I have personally reviewed and evaluated these images and lab results as part of my medical decision-making.   EKG Interpretation None      MDM   Final diagnoses:  Ureteral colic  Essential hypertension  L flank pain with nausea and vomiting.  Concern for possible kidney stone.  Negative for infection. No hematuria. CT shows left UVJ stone 3 mm with some hydronephrosis.  Patient is a smoker with some desaturation into the upper 80s with sleeping. She has no wheezing. She states that she is "getting over a URI". She declines a chest x-ray. Lungs are clear.  She states her pain is well-controlled and she wishes to go home. She is informed of her elevated blood pressure and  states she's been diagnosed with blood pressure issues in the past but does not take medication for it. Instructed to follow-up with PCP as well as urology regarding kidney stone. Return precautions discussed.  Ezequiel Essex, MD 06/01/15 [phone]

## 2015-06-01 NOTE — Discharge Instructions (Signed)
Kidney Stones Take the pain medication as prescribed. Follow-up with the urologist. You need to establish care with a primary doctor regarding your blood pressure. Return to the ED if you develop new or worsening symptoms. Kidney stones (urolithiasis) are deposits that form inside your kidneys. The intense pain is caused by the stone moving through the urinary tract. When the stone moves, the ureter goes into spasm around the stone. The stone is usually passed in the urine.  CAUSES   A disorder that makes certain neck glands produce too much parathyroid hormone (primary hyperparathyroidism).  A buildup of uric acid crystals, similar to gout in your joints.  Narrowing (stricture) of the ureter.  A kidney obstruction present at birth (congenital obstruction).  Previous surgery on the kidney or ureters.  Numerous kidney infections. SYMPTOMS   Feeling sick to your stomach (nauseous).  Throwing up (vomiting).  Blood in the urine (hematuria).  Pain that usually spreads (radiates) to the groin.  Frequency or urgency of urination. DIAGNOSIS   Taking a history and physical exam.  Blood or urine tests.  CT scan.  Occasionally, an examination of the inside of the urinary bladder (cystoscopy) is performed. TREATMENT   Observation.  Increasing your fluid intake.  Extracorporeal shock wave lithotripsy--This is a noninvasive procedure that uses shock waves to break up kidney stones.  Surgery may be needed if you have severe pain or persistent obstruction. There are various surgical procedures. Most of the procedures are performed with the use of small instruments. Only small incisions are needed to accommodate these instruments, so recovery time is minimized. The size, location, and chemical composition are all important variables that will determine the proper choice of action for you. Talk to your health care provider to better understand your situation so that you will minimize the  risk of injury to yourself and your kidney.  HOME CARE INSTRUCTIONS   Drink enough water and fluids to keep your urine clear or pale yellow. This will help you to pass the stone or stone fragments.  Strain all urine through the provided strainer. Keep all particulate matter and stones for your health care provider to see. The stone causing the pain may be as small as a grain of salt. It is very important to use the strainer each and every time you pass your urine. The collection of your stone will allow your health care provider to analyze it and verify that a stone has actually passed. The stone analysis will often identify what you can do to reduce the incidence of recurrences.  Only take over-the-counter or prescription medicines for pain, discomfort, or fever as directed by your health care provider.  Keep all follow-up visits as told by your health care provider. This is important.  Get follow-up X-rays if required. The absence of pain does not always mean that the stone has passed. It may have only stopped moving. If the urine remains completely obstructed, it can cause loss of kidney function or even complete destruction of the kidney. It is your responsibility to make sure X-rays and follow-ups are completed. Ultrasounds of the kidney can show blockages and the status of the kidney. Ultrasounds are not associated with any radiation and can be performed easily in a matter of minutes.  Make changes to your daily diet as told by your health care provider. You may be told to:  Limit the amount of salt that you eat.  Eat 5 or more servings of fruits and vegetables each day.  Limit the amount of meat, poultry, fish, and eggs that you eat.  Collect a 24-hour urine sample as told by your health care provider.You may need to collect another urine sample every 6-12 months. SEEK MEDICAL CARE IF:  You experience pain that is progressive and unresponsive to any pain medicine you have been  prescribed. SEEK IMMEDIATE MEDICAL CARE IF:   Pain cannot be controlled with the prescribed medicine.  You have a fever or shaking chills.  The severity or intensity of pain increases over 18 hours and is not relieved by pain medicine.  You develop a new onset of abdominal pain.  You feel faint or pass out.  You are unable to urinate.   This information is not intended to replace advice given to you by your health care provider. Make sure you discuss any questions you have with your health care provider.   Document Released: 03/14/2005 Document Revised: 12/03/2014 Document Reviewed: 08/15/2012 Elsevier Interactive Patient Education 2016 [employer].   Hypertension Hypertension, commonly called high blood pressure, is when the force of blood pumping through your arteries is too strong. Your arteries are the blood vessels that carry blood from your heart throughout your body. A blood pressure reading consists of a higher number over a lower number, such as 110/72. The higher number (systolic) is the pressure inside your arteries when your heart pumps. The lower number (diastolic) is the pressure inside your arteries when your heart relaxes. Ideally you want your blood pressure below 120/80. Hypertension forces your heart to work harder to pump blood. Your arteries may become narrow or stiff. Having untreated or uncontrolled hypertension can cause heart attack, stroke, kidney disease, and other problems. RISK FACTORS Some risk factors for high blood pressure are controllable. Others are not.  Risk factors you cannot control include:   Race. You may be at higher risk if you are African American.  Age. Risk increases with age.  Gender. Men are at higher risk than women before age 8 years. After age 56, women are at higher risk than men. Risk factors you can control include:  Not getting enough exercise or physical activity.  Being overweight.  Getting too much fat, sugar,  calories, or salt in your diet.  Drinking too much alcohol. SIGNS AND SYMPTOMS Hypertension does not usually cause signs or symptoms. Extremely high blood pressure (hypertensive crisis) may cause headache, anxiety, shortness of breath, and nosebleed. DIAGNOSIS To check if you have hypertension, your health care provider will measure your blood pressure while you are seated, with your arm held at the level of your heart. It should be measured at least twice using the same arm. Certain conditions can cause a difference in blood pressure between your right and left arms. A blood pressure reading that is higher than normal on one occasion does not mean that you need treatment. If it is not clear whether you have high blood pressure, you may be asked to return on a different day to have your blood pressure checked again. Or, you may be asked to monitor your blood pressure at home for 1 or more weeks. TREATMENT Treating high blood pressure includes making lifestyle changes and possibly taking medicine. Living a healthy lifestyle can help lower high blood pressure. You may need to change some of your habits. Lifestyle changes may include:  Following the DASH diet. This diet is high in fruits, vegetables, and whole grains. It is low in salt, red meat, and added sugars.  Keep your sodium  intake below 2,300 mg per day.  Getting at least 30-45 minutes of aerobic exercise at least 4 times per week.  Losing weight if necessary.  Not smoking.  Limiting alcoholic beverages.  Learning ways to reduce stress. Your health care provider may prescribe medicine if lifestyle changes are not enough to get your blood pressure under control, and if one of the following is true:  You are 61-19 years of age and your systolic blood pressure is above 140.  You are 46 years of age or older, and your systolic blood pressure is above 150.  Your diastolic blood pressure is above 90.  You have diabetes, and your  systolic blood pressure is over [id number] or your diastolic blood pressure is over 90.  You have kidney disease and your blood pressure is above 140/90.  You have heart disease and your blood pressure is above 140/90. Your personal target blood pressure may vary depending on your medical conditions, your age, and other factors. HOME CARE INSTRUCTIONS  Have your blood pressure rechecked as directed by your health care provider.   Take medicines only as directed by your health care provider. Follow the directions carefully. Blood pressure medicines must be taken as prescribed. The medicine does not work as well when you skip doses. Skipping doses also puts you at risk for problems.  Do not smoke.   Monitor your blood pressure at home as directed by your health care provider. SEEK MEDICAL CARE IF:   You think you are having a reaction to medicines taken.  You have recurrent headaches or feel dizzy.  You have swelling in your ankles.  You have trouble with your vision. SEEK IMMEDIATE MEDICAL CARE IF:  You develop a severe headache or confusion.  You have unusual weakness, numbness, or feel faint.  You have severe chest or abdominal pain.  You vomit repeatedly.  You have trouble breathing. MAKE SURE YOU:   Understand these instructions.  Will watch your condition.  Will get help right away if you are not doing well or get worse.   This information is not intended to replace advice given to you by your health care provider. Make sure you discuss any questions you have with your health care provider.   Document Released: 03/14/2005 Document Revised: 07/29/2014 Document Reviewed: 01/04/2013 Elsevier Interactive Patient Education [employer].

## 2015-06-01 NOTE — ED Notes (Signed)
Pt states she feels she has a kidney stone. Reports L flank pain that woke her up, feeling the urge to urinate but unable to do so, and nausea and vomiting. Denies fever or other symptoms.

## 2017-02-02 ENCOUNTER — Other Ambulatory Visit: Payer: Self-pay

## 2017-02-02 ENCOUNTER — Emergency Department (HOSPITAL_BASED_OUTPATIENT_CLINIC_OR_DEPARTMENT_OTHER)
Admission: EM | Admit: 2017-02-02 | Discharge: 2017-02-02 | Discharge status: Against Medical Advice | Payer: Medicare Other | Attending: Emergency Medicine | Admitting: Emergency Medicine

## 2017-02-02 ENCOUNTER — Emergency Department (HOSPITAL_BASED_OUTPATIENT_CLINIC_OR_DEPARTMENT_OTHER): Payer: Medicare Other

## 2017-02-02 ENCOUNTER — Encounter (HOSPITAL_BASED_OUTPATIENT_CLINIC_OR_DEPARTMENT_OTHER): Payer: Self-pay | Admitting: *Deleted

## 2017-02-02 DIAGNOSIS — Z79899 Other long term (current) drug therapy: Secondary | ICD-10-CM | POA: Insufficient documentation

## 2017-02-02 DIAGNOSIS — K529 Noninfective gastroenteritis and colitis, unspecified: Secondary | ICD-10-CM | POA: Diagnosis not present

## 2017-02-02 DIAGNOSIS — F17200 Nicotine dependence, unspecified, uncomplicated: Secondary | ICD-10-CM | POA: Insufficient documentation

## 2017-02-02 DIAGNOSIS — Z7982 Long term (current) use of aspirin: Secondary | ICD-10-CM | POA: Diagnosis not present

## 2017-02-02 DIAGNOSIS — Z5320 Procedure and treatment not carried out because of patient's decision for unspecified reasons: Secondary | ICD-10-CM | POA: Diagnosis not present

## 2017-02-02 DIAGNOSIS — N289 Disorder of kidney and ureter, unspecified: Secondary | ICD-10-CM | POA: Diagnosis not present

## 2017-02-02 DIAGNOSIS — I10 Essential (primary) hypertension: Secondary | ICD-10-CM | POA: Diagnosis not present

## 2017-02-02 DIAGNOSIS — R1084 Generalized abdominal pain: Secondary | ICD-10-CM | POA: Diagnosis present

## 2017-02-02 LAB — URINALYSIS, ROUTINE W REFLEX MICROSCOPIC
Glucose, UA: NEGATIVE mg/dL
Hgb urine dipstick: NEGATIVE
KETONES UR: NEGATIVE mg/dL
NITRITE: NEGATIVE
PROTEIN: 100 mg/dL — AB
Specific Gravity, Urine: >1.03 — ABNORMAL HIGH (ref 1.005–1.030)
pH: 6 (ref 5.0–8.0)

## 2017-02-02 LAB — CBC WITH DIFFERENTIAL/PLATELET
BASOS ABS: 0 10*3/uL (ref 0.0–0.1)
BASOS PCT: 0 %
EOS ABS: 0 10*3/uL (ref 0.0–0.7)
Eosinophils Relative: 0 %
HCT: 39.9 % (ref 36.0–46.0)
HEMOGLOBIN: 13.6 g/dL (ref 12.0–15.0)
Lymphocytes Relative: 13 %
Lymphs Abs: 1.9 10*3/uL (ref 0.7–4.0)
MCH: 31.1 pg (ref 26.0–34.0)
MCHC: 34.1 g/dL (ref 30.0–36.0)
MCV: 91.3 fL (ref 78.0–100.0)
MONOS PCT: 6 %
Monocytes Absolute: 0.8 10*3/uL (ref 0.1–1.0)
NEUTROS PCT: 81 %
Neutro Abs: 12.2 10*3/uL — ABNORMAL HIGH (ref 1.7–7.7)
Platelets: 297 10*3/uL (ref 150–400)
RBC: 4.37 MIL/uL (ref 3.87–5.11)
RDW: 13.5 % (ref 11.5–15.5)
WBC: 15 10*3/uL — AB (ref 4.0–10.5)

## 2017-02-02 LAB — COMPREHENSIVE METABOLIC PANEL
ALBUMIN: 3.8 g/dL (ref 3.5–5.0)
ALK PHOS: 67 U/L (ref 38–126)
ALT: 17 U/L (ref 14–54)
ANION GAP: 10 (ref 5–15)
AST: 28 U/L (ref 15–41)
BUN: 36 mg/dL — AB (ref 6–20)
CALCIUM: 9 mg/dL (ref 8.9–10.3)
CO2: 19 mmol/L — AB (ref 22–32)
Chloride: 102 mmol/L (ref 101–111)
Creatinine, Ser: 1.63 mg/dL — ABNORMAL HIGH (ref 0.44–1.00)
GFR calc Af Amer: 33 mL/min — ABNORMAL LOW (ref >60)
GFR calc non Af Amer: 28 mL/min — ABNORMAL LOW (ref >60)
GLUCOSE: 123 mg/dL — AB (ref 65–99)
POTASSIUM: 4.1 mmol/L (ref 3.5–5.1)
SODIUM: 131 mmol/L — AB (ref 135–145)
Total Bilirubin: 0.8 mg/dL (ref 0.3–1.2)
Total Protein: 10.1 g/dL — ABNORMAL HIGH (ref 6.5–8.1)

## 2017-02-02 LAB — URINALYSIS, MICROSCOPIC (REFLEX)

## 2017-02-02 LAB — LIPASE, BLOOD: Lipase: 42 U/L (ref 11–51)

## 2017-02-02 MED ORDER — CIPROFLOXACIN HCL 500 MG PO TABS: 500.0000 mg | ORAL_TABLET | Freq: Two times a day (BID) | ORAL | 0 refills | Status: DC

## 2017-02-02 MED ORDER — ONDANSETRON HCL 4 MG/2ML IJ SOLN
4.0000 mg | Freq: Once | INTRAMUSCULAR | Status: AC
  Administered 2017-02-02: 4 mg via INTRAVENOUS
  Filled 2017-02-02: qty 2

## 2017-02-02 MED ORDER — SODIUM CHLORIDE 0.9 % IV BOLUS (SEPSIS)
500.0000 mL | Freq: Once | INTRAVENOUS | Status: AC
  Administered 2017-02-02: 500 mL via INTRAVENOUS

## 2017-02-02 MED ORDER — METRONIDAZOLE 500 MG PO TABS: 500.0000 mg | ORAL_TABLET | Freq: Three times a day (TID) | ORAL | 0 refills | Status: DC

## 2017-02-02 MED ORDER — FENTANYL CITRATE (PF) 100 MCG/2ML IJ SOLN
50.0000 ug | Freq: Once | INTRAMUSCULAR | Status: AC
  Administered 2017-02-02: 50 ug via INTRAVENOUS
  Filled 2017-02-02: qty 2

## 2017-02-02 NOTE — Discharge Instructions (Signed)
You are leaving [place].  Feel free to return for further treatment.  Follow-up as soon as possible your primary care doctor and you will need to follow-up with gastroenterology also.

## 2017-02-02 NOTE — ED Notes (Signed)
Patient transported to CT 

## 2017-02-02 NOTE — ED Triage Notes (Signed)
Pt c/o diffuse abd pain with nausea x 2 days

## 2017-02-02 NOTE — ED Provider Notes (Signed)
[facility] EMERGENCY DEPARTMENT Provider Note   CSN: [id number] Arrival date & time: 02/02/17  1453     History   Chief Complaint Chief Complaint  Patient presents with  . Abdominal Pain    HPI Wanda Gallegos is a 83 y.o. female.  HPI Patient presents with abdominal pain.  Has had nausea and some diarrhea.  It is in diffusely over abdomen and dull.  Began in the lower abdomen.  States she has had some coffee-ground stool when she wipes it is brown.  Pain has continued to worsen over the last couple days.  Previous history hysterectomy cholecystectomy and reportedly appendectomy.  Also states she had a hernia repair on her abdomen previously.  Abdomen have gotten a little larger.  No fevers.  Has had a mildly decreased appetite. Past Medical History:  Diagnosis Date  . Cancer ([place])   . Hypertension     There are no active problems to display for this patient.   Past Surgical History:  Procedure Laterality Date  . ABDOMINAL HYSTERECTOMY    . BREAST LUMPECTOMY    . HERNIA REPAIR      OB History    No data available       Home Medications    Prior to Admission medications   Medication Sig Start Date End Date Taking? Authorizing Provider  aspirin EC 81 MG tablet Take 81 mg by mouth daily.    [provider]  ciprofloxacin (CIPRO) 500 MG tablet Take 1 tablet (500 mg total) 2 (two) times daily by mouth. 02/02/17   Davonna Belling, MD  HYDROcodone-acetaminophen (NORCO/VICODIN) 5-325 MG tablet Take 1 tablet by mouth every 4 (four) hours as needed. 06/01/15   Rancour, Annie Main, MD  lisinopril (PRINIVIL,ZESTRIL) 10 MG tablet Take 1 tablet (10 mg total) by mouth daily. 10/19/13   Julianne Rice, MD  metroNIDAZOLE (FLAGYL) 500 MG tablet Take 1 tablet (500 mg total) 3 (three) times daily by mouth. 02/02/17   Davonna Belling, MD  ondansetron (ZOFRAN) 4 MG tablet Take 1 tablet (4 mg total) by mouth every 6 (six) hours. 06/01/15   Ezequiel Essex, MD     Family History History reviewed. No pertinent family history.  Social History Social History   Tobacco Use  . Smoking status: Current Every Day Smoker    Packs/day: 1.00  . Smokeless tobacco: Never Used  Substance Use Topics  . Alcohol use: No  . Drug use: No     Allergies   Patient has no known allergies.   Review of Systems Review of Systems  Constitutional: Negative for appetite change.  HENT: Negative for congestion.   Respiratory: Negative for shortness of breath.   Cardiovascular: Negative for chest pain.  Gastrointestinal: Positive for abdominal pain, constipation, diarrhea, nausea and vomiting.  Genitourinary: Negative for dysuria.  Musculoskeletal: Negative for back pain.  Skin: Negative for rash.  Neurological: Negative for seizures.  Psychiatric/Behavioral: Negative for confusion.     Physical Exam Updated Vital Signs BP 140/71 (BP Location: Left Arm)   Pulse 93   Temp 98.3 F (36.8 C) (Oral)   Resp 16   Ht 4\' 11"  (1.499 m)   Wt 59 kg (130 lb)   SpO2 90%   BMI 26.26 kg/m   Physical Exam  Constitutional: She appears well-developed.  HENT:  Head: Atraumatic.  Eyes: Pupils are equal, round, and reactive to light.  Cardiovascular:  Mild tachycardia  Abdominal: There is no hepatosplenomegaly. There is generalized tenderness. No hernia.  Patient with  diffuse abdominal pain that may be worse in the lower abdomen.  Some distention.  No hernias palpated.  Neurological: She is alert.  Skin: Skin is warm. Capillary refill takes less than 2 seconds.  Psychiatric: She has a normal mood and affect.     ED Treatments / Results  Labs (all labs ordered are listed, but only abnormal results are displayed) Labs Reviewed  URINALYSIS, ROUTINE W REFLEX MICROSCOPIC - Abnormal; Notable for the following components:      Result Value   APPearance CLOUDY (*)    Specific Gravity, Urine >1.030 (*)    Bilirubin Urine SMALL (*)    Protein, ur 100 (*)     Leukocytes, UA MODERATE (*)    All other components within normal limits  CBC WITH DIFFERENTIAL/PLATELET - Abnormal; Notable for the following components:   WBC 15.0 (*)    Neutro Abs 12.2 (*)    All other components within normal limits  COMPREHENSIVE METABOLIC PANEL - Abnormal; Notable for the following components:   Sodium 131 (*)    CO2 19 (*)    Glucose, Bld 123 (*)    BUN 36 (*)    Creatinine, Ser 1.63 (*)    Total Protein 10.1 (*)    GFR calc non Af Amer 28 (*)    GFR calc Af Amer 33 (*)    All other components within normal limits  URINALYSIS, MICROSCOPIC (REFLEX) - Abnormal; Notable for the following components:   Bacteria, UA FEW (*)    Squamous Epithelial / LPF 6-30 (*)    All other components within normal limits  LIPASE, BLOOD    EKG  EKG Interpretation None       Radiology Ct Abdomen Pelvis Wo Contrast  Result Date: 02/02/2017 CLINICAL DATA:  Abdominal pain EXAM: CT ABDOMEN AND PELVIS WITHOUT CONTRAST TECHNIQUE: Multidetector CT imaging of the abdomen and pelvis was performed following the standard protocol without IV contrast. COMPARISON:  06/01/2015 FINDINGS: Lower chest: Patchy ground-glass density in the lung bases. No pleural effusion or consolidation. Coronary vessel calcification. Mitral calcification. Trace pericardial effusion or thickening. Small hiatal hernia stable oval smooth nodule in the outer right breast possibly a lymph node. Multiple calcifications are tissue clips in the lower right breast. Hepatobiliary: No focal liver abnormality is seen. Status post cholecystectomy. No biliary dilatation. Pancreas: Unremarkable. No pancreatic ductal dilatation or surrounding inflammatory changes. Spleen: Multiple calcified granuloma Adrenals/Urinary Tract: Right adrenal gland is normal. Stable 19 mm low-density left adrenal mass consistent with adenoma. Multiple cysts bilaterally. No hydronephrosis. Punctate nonobstructing stone in the upper pole of the right  kidney and the lower pole of the left kidney. Bladder normal Stomach/Bowel: Stomach is nonenlarged. No dilated small bowel. Appendix not confidently visualized. Focal wall thickening of the descending colon and sigmoid colon with moderate surrounding inflammation. Sigmoid colon diverticula are present. There are no extraluminal gas collections. Vascular/Lymphatic: Moderate atherosclerosis of the aorta. No aneurysmal dilatation. No significantly enlarged lymph nodes Reproductive: Status post hysterectomy. No adnexal masses. Other: Negative for free air. Tiny free fluid in the pelvis. Fatty periumbilical hernia Musculoskeletal: Degenerative change. No acute or suspicious bone lesion IMPRESSION: 1. Focal wall thickening and surrounding moderate inflammation involving the descending and sigmoid colon. Sigmoid colon diverticula are present, but the most diseased segment of bowel at the distal descending/proximal sigmoid colon does not appear to have significant diverticular change, findings favored to represent colitis of infectious, inflammatory, or ischemic etiology, over diverticulitis. No extraluminal gas or abscess. Follow-up colonoscopy after  resolution of acute symptoms is recommended to exclude a mass as possible cause for colon wall thickening. 2. Stable 19 mm left adrenal gland adenoma 3. Punctate nonobstructing stones within the kidneys. Electronically Signed   By: Donavan Foil M.D.   On: 02/02/2017 20:47    Procedures Procedures (including critical care time)  Medications Ordered in ED Medications  sodium chloride 0.9 % bolus 500 mL (0 mLs Intravenous Stopped 02/02/17 1810)  fentaNYL (SUBLIMAZE) injection 50 mcg (50 mcg Intravenous Given 02/02/17 1743)  ondansetron (ZOFRAN) injection 4 mg (4 mg Intravenous Given 02/02/17 1742)  ondansetron (ZOFRAN) injection 4 mg (4 mg Intravenous Given 02/02/17 2042)     Initial Impression / Assessment and Plan / ED Course  I have reviewed the triage vital signs  and the nursing notes.  Pertinent labs & imaging results that were available during my care of the patient were reviewed by me and considered in my medical decision making (see chart for details).    Patient with colitis and abdominal pain.  Continued pain and increased creatinine.  However patient was not willing to stay for the admission that I recommended.  Left AMA.  Will treat with antibiotics patient will hopefully follow-up soon.  Final Clinical Impressions(s) / ED Diagnoses   Final diagnoses:  Colitis  Renal insufficiency    ED Discharge Orders        Ordered    metroNIDAZOLE (FLAGYL) 500 MG tablet  3 times daily     02/02/17 2154    ciprofloxacin (CIPRO) 500 MG tablet  2 times daily     02/02/17 2154       Davonna Belling, MD 02/03/17 0028

## 2017-02-07 ENCOUNTER — Encounter: Payer: Self-pay | Admitting: Gastroenterology

## 2017-04-03 ENCOUNTER — Ambulatory Visit: Payer: Medicare Other | Admitting: Gastroenterology

## 2018-03-22 ENCOUNTER — Other Ambulatory Visit: Payer: Self-pay

## 2018-03-22 ENCOUNTER — Emergency Department (HOSPITAL_BASED_OUTPATIENT_CLINIC_OR_DEPARTMENT_OTHER): Payer: Medicare Other

## 2018-03-22 ENCOUNTER — Emergency Department (HOSPITAL_BASED_OUTPATIENT_CLINIC_OR_DEPARTMENT_OTHER)
Admission: EM | Admit: 2018-03-22 | Discharge: 2018-03-22 | Disposition: A | Discharge status: Home / Self Care | Payer: Medicare Other | Attending: Emergency Medicine | Admitting: Emergency Medicine

## 2018-03-22 ENCOUNTER — Encounter (HOSPITAL_BASED_OUTPATIENT_CLINIC_OR_DEPARTMENT_OTHER): Payer: Self-pay | Admitting: Emergency Medicine

## 2018-03-22 DIAGNOSIS — Z7982 Long term (current) use of aspirin: Secondary | ICD-10-CM | POA: Insufficient documentation

## 2018-03-22 DIAGNOSIS — F17200 Nicotine dependence, unspecified, uncomplicated: Secondary | ICD-10-CM | POA: Diagnosis not present

## 2018-03-22 DIAGNOSIS — I10 Essential (primary) hypertension: Secondary | ICD-10-CM | POA: Insufficient documentation

## 2018-03-22 DIAGNOSIS — Z859 Personal history of malignant neoplasm, unspecified: Secondary | ICD-10-CM | POA: Insufficient documentation

## 2018-03-22 DIAGNOSIS — M25561 Pain in right knee: Secondary | ICD-10-CM | POA: Diagnosis present

## 2018-03-22 MED ORDER — DEXAMETHASONE 6 MG PO TABS
6.0000 mg | ORAL_TABLET | Freq: Once | ORAL | Status: AC
  Administered 2018-03-22: 6 mg via ORAL
  Filled 2018-03-22: qty 1

## 2018-03-22 MED ORDER — DICLOFENAC SODIUM 1 % TD GEL: 4.0000 g | Freq: Four times a day (QID) | TRANSDERMAL | 0 refills | Status: AC

## 2018-03-22 MED ORDER — APAP 325 MG PO TABS: 650.0000 mg | ORAL_TABLET | Freq: Four times a day (QID) | ORAL | 0 refills | Status: AC | PRN

## 2018-03-22 MED FILL — DICLOFENAC SODIUM 1 % GEL: 1 | 7 days supply | Qty: 100 | Fill #0

## 2018-03-22 MED FILL — ACETAMINOPHEN 325 MG TABLET: 325 | 13 days supply | Qty: 100 | Fill #0

## 2018-03-22 NOTE — Discharge Instructions (Signed)
Your x-ray showed evidence of CPPD which is also known as pseudogout.  I do not want to give you NSAIDs given your borderline elevated kidney function during your last visit in the hospital.  Please follow-up with your PCP for this.  You were given a dose of steroids in the department.  I am also prescribing you a topical NSAID called Voltaren, Tylenol and have given you a knee sleeve.  Please follow-up with your primary care provider or orthopedics in the next 1 week if this continues to give you difficulty.  Please see attached handout. If you develop worsening or new concerning symptoms you can return to the emergency department for re-evaluation.

## 2018-03-22 NOTE — ED Provider Notes (Signed)
[facility] EMERGENCY DEPARTMENT Provider Note   CSN: [id number] Arrival date & time: 03/22/18  5852     History   Chief Complaint Chief Complaint  Patient presents with  . Knee weakness    HPI Latona Service is a 84 y.o. female with history of hypertension presents emergency room today for right knee pain.  Patient reports that yesterday she was walking to her back door when she felt a large pop and her right knee.  She experienced pain on the lower, medial aspect of her knee that she describes as sharp.  She reports now when placing pressure on that she feels a sensation and feels like it is giving way.  She denies any current pain but is more concerned about the knee feeling like it is getting give way.  She notes some associated catching and clicking of the knee with walking.  Her current pain level is a 0/10.  Her symptoms are aggravated by walking.  She notes no relieving symptoms other than rest.  She denies history of similar symptoms in the past.  She is never had any difficulties with this knee and denies prior surgeries or trauma.  No falls.  She denies any lower extremity swelling, numbness, tingling or weakness. She has been able to walk without assistance since the event.   HPI  Past Medical History:  Diagnosis Date  . Cancer ([place])   . Hypertension     There are no active problems to display for this patient.   Past Surgical History:  Procedure Laterality Date  . ABDOMINAL HYSTERECTOMY    . ABDOMINAL SURGERY    . BREAST LUMPECTOMY    . HERNIA REPAIR       OB History   No obstetric history on file.      Home Medications    Prior to Admission medications   Medication Sig Start Date End Date Taking? Authorizing Provider  albuterol (PROVENTIL HFA;VENTOLIN HFA) 108 (90 Base) MCG/ACT inhaler Inhale into the lungs. 03/10/18  Yes [provider]  amLODipine (NORVASC) 10 MG tablet TAKE 1 TABLET(10 MG) BY MOUTH DAILY 03/19/18  Yes [provider]  aspirin EC 81 MG tablet Take 81 mg by mouth daily.    [provider]    Family History No family history on file.  Social History Social History   Tobacco Use  . Smoking status: Current Every Day Smoker    Packs/day: 1.00  . Smokeless tobacco: Never Used  Substance Use Topics  . Alcohol use: No  . Drug use: No     Allergies   Patient has no known allergies.   Review of Systems Review of Systems  Constitutional: Negative for fever.  Musculoskeletal: Positive for arthralgias. Negative for joint swelling.  Skin: Negative for color change and wound.  Neurological: Negative for numbness.     Physical Exam Updated Vital Signs BP (!) 143/72 (BP Location: Left Arm)   Pulse 84   Temp 97.7 F (36.5 C) (Oral)   Resp 16   Ht 4\' 10"  (1.473 m)   Wt 54.4 kg   SpO2 96%   BMI 25.08 kg/m   Physical Exam Vitals signs and nursing note reviewed.  Constitutional:      Appearance: She is well-developed.  HENT:     Head: Normocephalic and atraumatic.     Right Ear: External ear normal.     Left Ear: External ear normal.  Eyes:     General: No scleral icterus.  Right eye: No discharge.        Left eye: No discharge.     Conjunctiva/sclera: Conjunctivae normal.  Cardiovascular:     Pulses:          Dorsalis pedis pulses are 2+ on the right side.       Posterior tibial pulses are 2+ on the right side.     Comments: No lower extremity edema.  Calves are nontender.  Calves are symmetric in size bilaterally. Pulmonary:     Effort: Pulmonary effort is normal. No respiratory distress.  Musculoskeletal:     Right hip: She exhibits normal range of motion and no tenderness.     Right ankle: No tenderness. Achilles tendon normal.     Comments: Appearance normal. No obvious deformity. No skin swelling, erythema, heat, fluctuance or break of the skin. TTP over medial joint line. Active and passive flexion and extension intact without pain but noted  crepitus. Negative Lachman's test. Negative anterior/poster drawer bilaterally.  Equivocal McMurray's test. Negative ballottement test. No varus or valgus laxity or locking. No TTP of hips or ankles. Compartments soft. Neurovascularly intact distally to site of injury.  Intact straight leg raise   Skin:    Coloration: Skin is not pale.     Findings: No erythema.  Neurological:     Mental Status: She is alert.     Sensory: Sensation is intact.     Motor: Motor function is intact.      ED Treatments / Results  Labs (all labs ordered are listed, but only abnormal results are displayed) Labs Reviewed - No data to display  EKG None  Radiology No results found.  Procedures Procedures (including critical care time)  Medications Ordered in ED Medications - No data to display   Initial Impression / Assessment and Plan / ED Course  I have reviewed the triage vital signs and the nursing notes.  Pertinent labs & imaging results that were available during my care of the patient were reviewed by me and considered in my medical decision making (see chart for details).      84 y.o. female with right knee pain after feeling a pop yesterday not feeling like her knee is giving way when walking and feeling a clicking/popping sensation.  Patient denies trauma. Normal ROM. No deformity.  Equivocal McMurray's.  Intact straight leg raise.  No concern for quadricep rupture or patellar fracture.  Patient denies dislocation/relocation.  No concern for arterial injury.  Do not suspect DVT.  She is neurovascular intact distally.  Compartments are soft.  Will obtain screening x-ray to evaluate.  Suspect will treat with conservative therapies, brace, and follow-up with orthopedics versus PCP. I do not suspect septic joint given hx, no fever, no joint swelling, erythema and normal ROM.  X-ray reviewed.  There is noted to be mild osteoarthritis. Possible loose body also seen in findings, but unsure  relevance to pain.  There is also noted to be CPPD.  On chart review patient cannot have NSAIDs that she had an elevated last creatinine during hospital stay.  No history of prior ulcers, or diabetes.  Will give dose of Decadron here.  Will treat with Tylenol, Voltaren, knee sleeve and follow-up with PCP versus orthopedics. Patient and family in agreement with this. Return precautions discussed.  Patient appears safe for discharge.  Final Clinical Impressions(s) / ED Diagnoses   Final diagnoses:  Acute pain of right knee    ED Discharge Orders  Ordered    diclofenac sodium (VOLTAREN) 1 % GEL  4 times daily     03/22/18 1122    acetaminophen 325 MG tablet  Every 6 hours PRN     03/22/18 1122           Lorelle Gibbs 03/22/18 [place], [place], DO 03/22/18 1555

## 2018-03-22 NOTE — ED Notes (Signed)
ED Provider at bedside. 

## 2018-03-22 NOTE — ED Triage Notes (Signed)
R knee pain since yesterday. She states she was walking and felt a pop. Pt d/c last week following surgery for SBO

## 2019-04-14 IMAGING — CR DG KNEE COMPLETE 4+V*R*
4 series · 4 of 4 positions shown · non-contrast
Comparison: MRI right knee 05/02/2017. Plain films right knee
04/13/2017.

CLINICAL DATA: The patient felt a pop in the right knee yesterday
while walking. Pain.

EXAM:
RIGHT KNEE - COMPLETE 4+ VIEW

[t knee ap right]
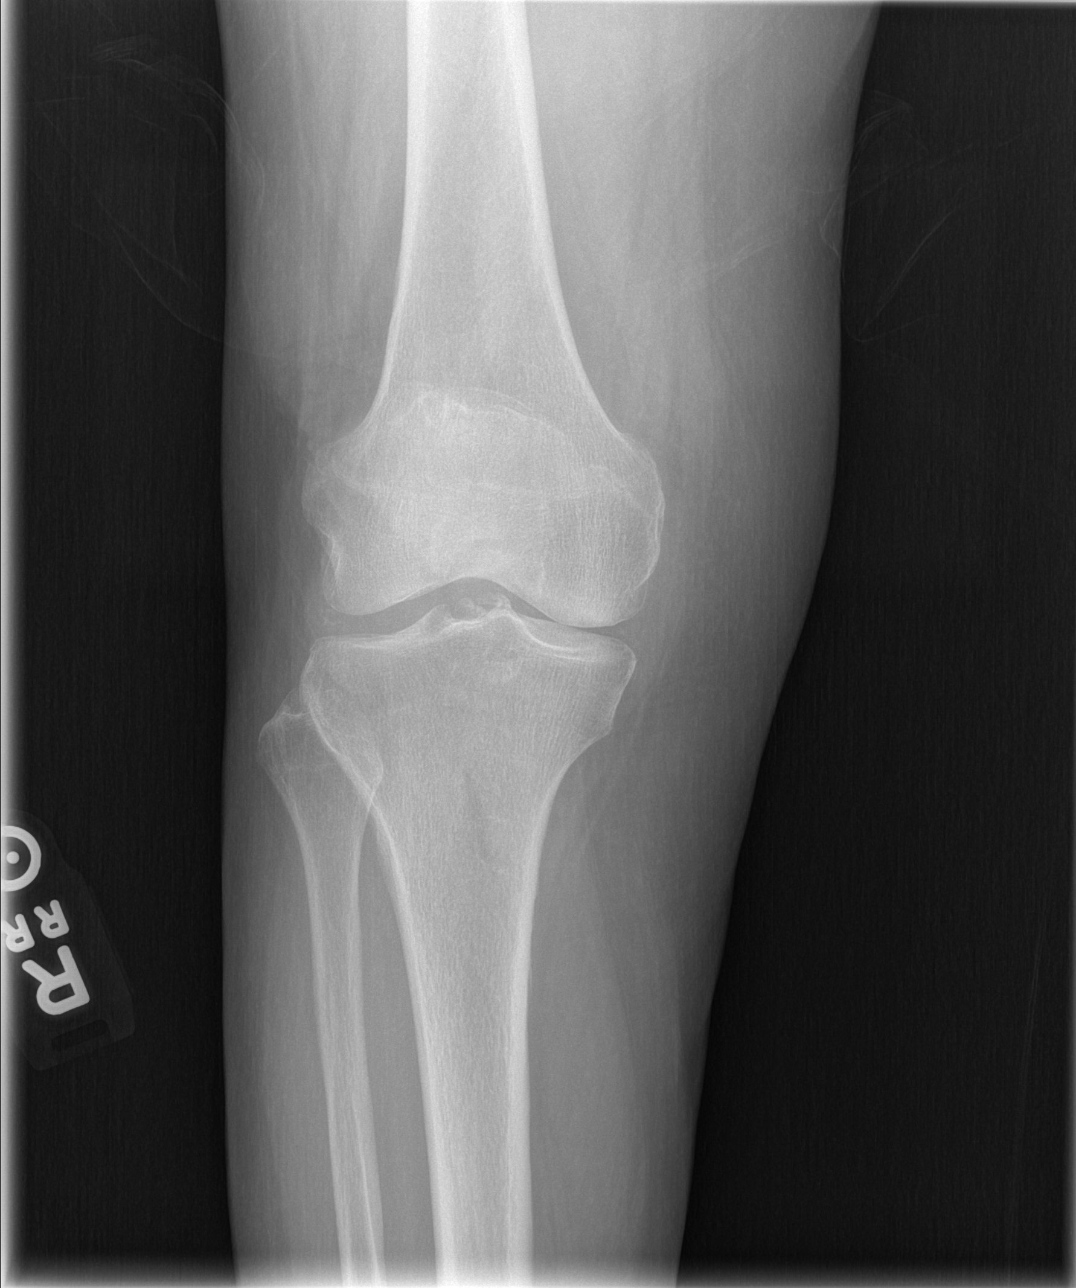

[t knee oblique right (1 of 2)]
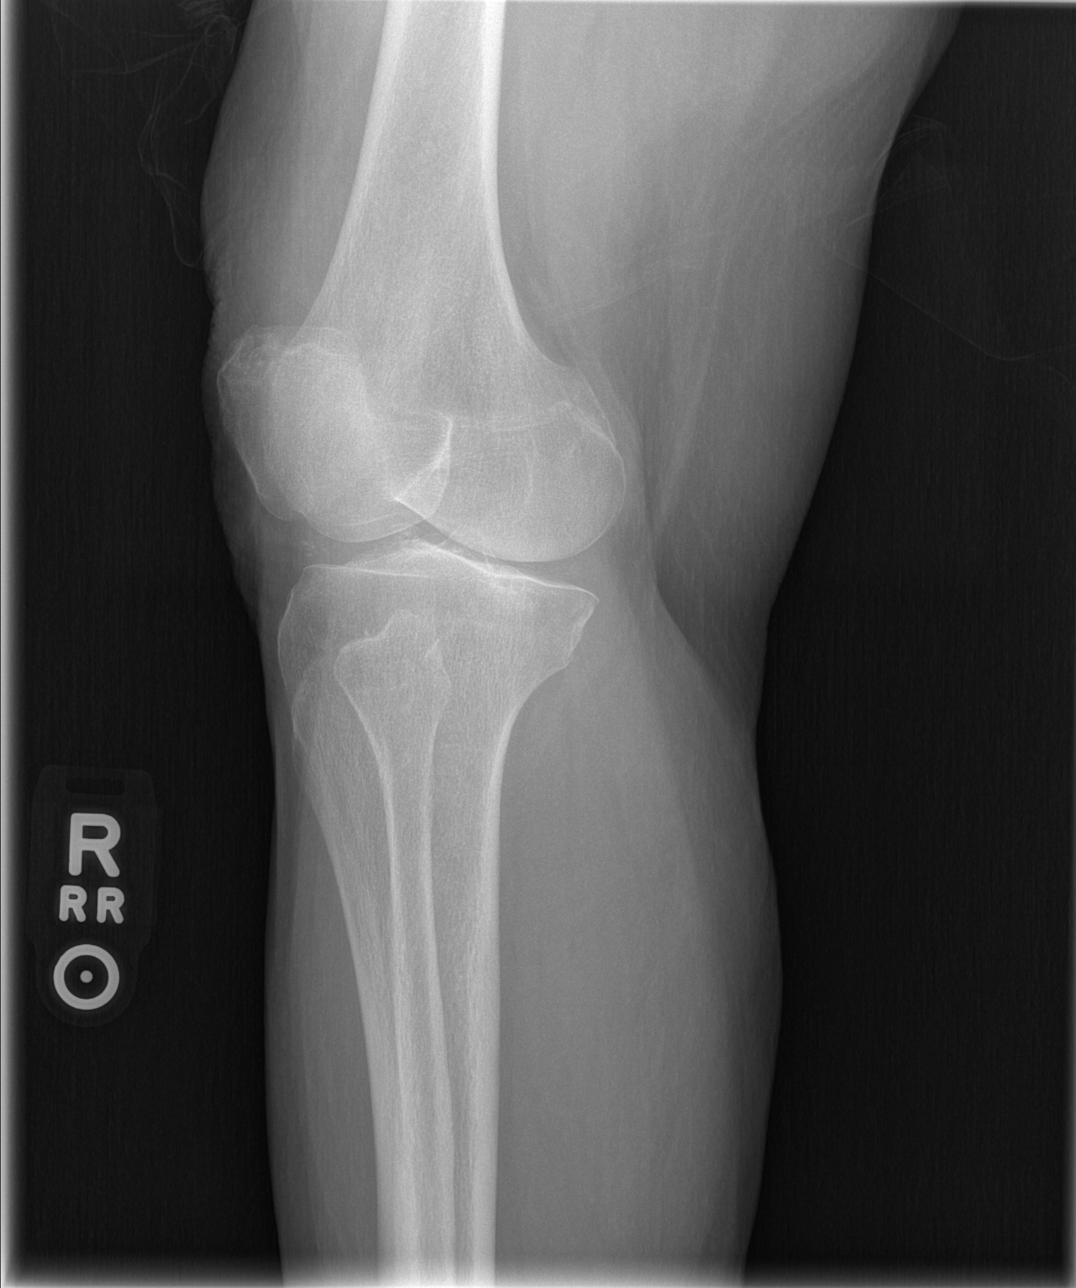

[t knee oblique right (2 of 2)]
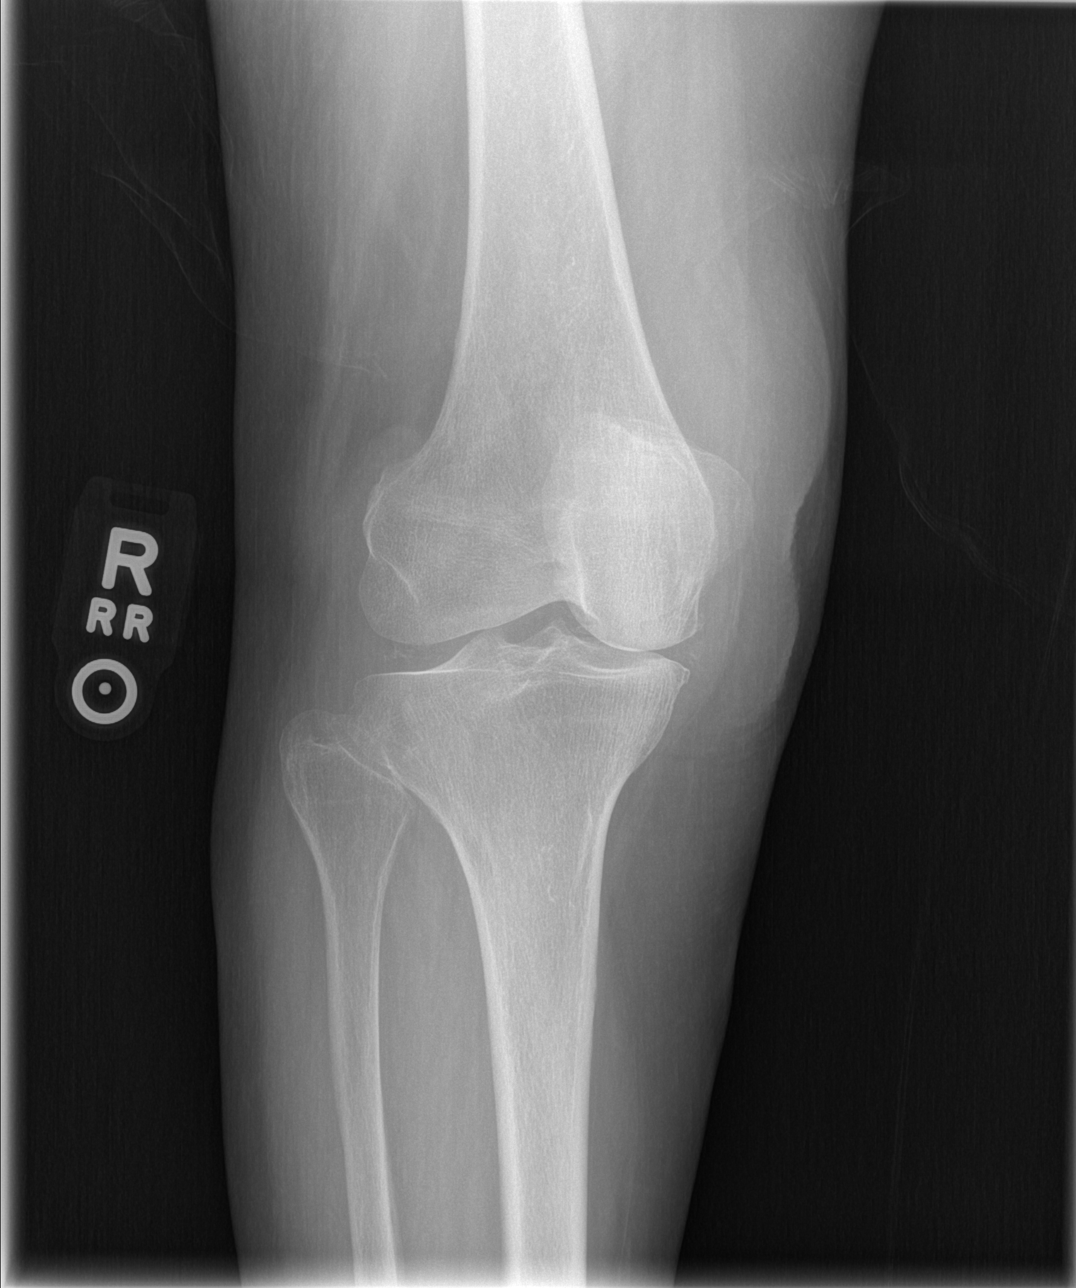

[t knee lat right]
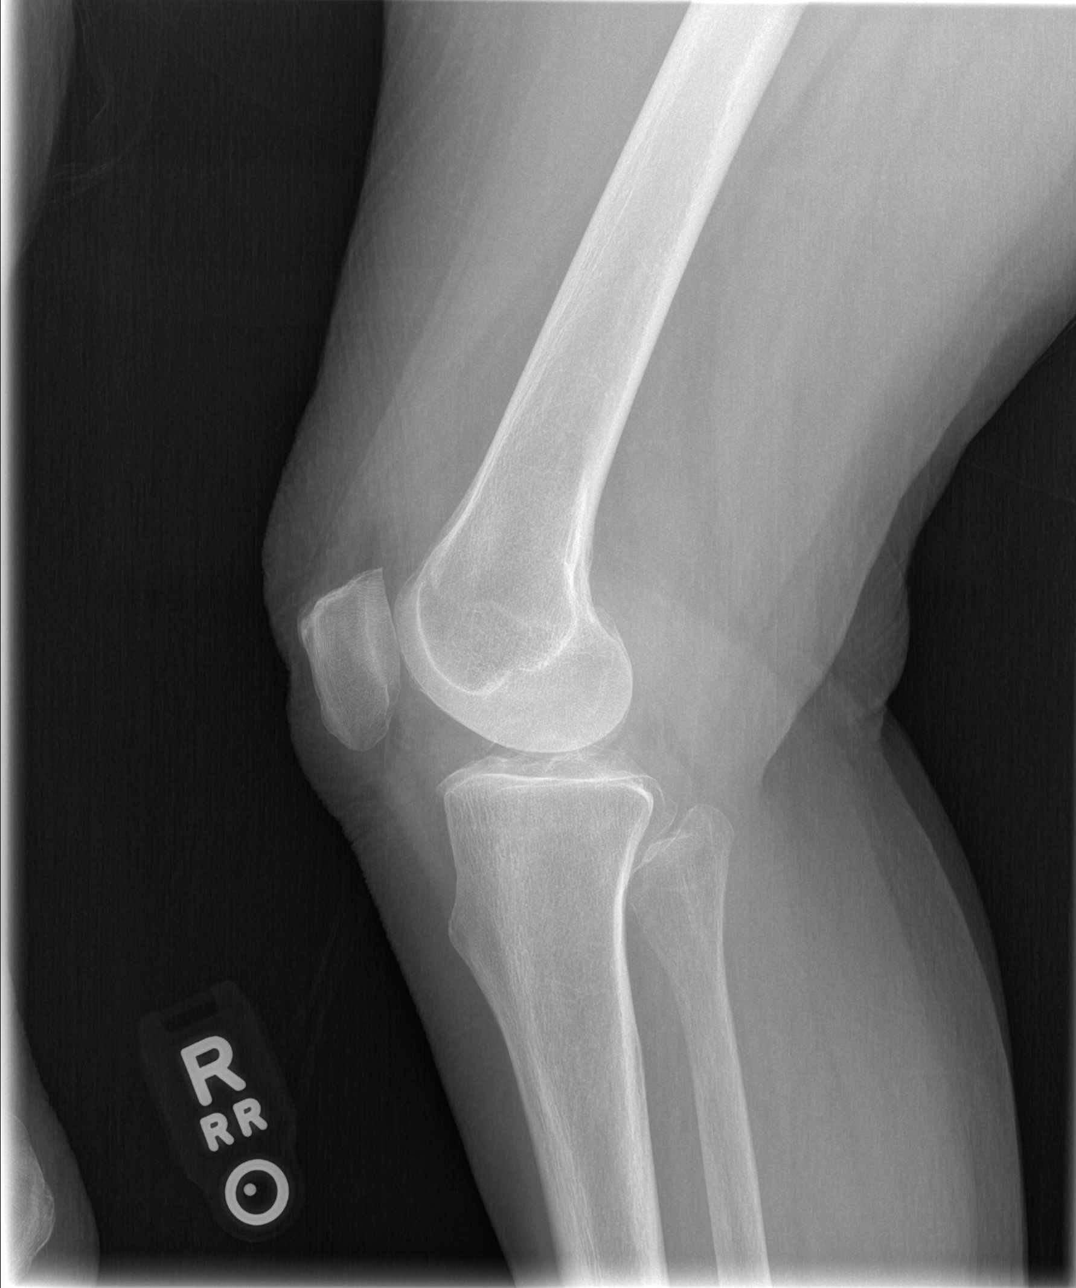

[4 of 4 positions shown; findings below may reference images not displayed]

FINDINGS: No acute bony or joint abnormality is identified. Joint spaces are
preserved. Chondrocalcinosis is noted. There is some peaking of the
tibial spines. 0.4 cm in diameter loose body in the central aspect
of the joint noted. Small to moderate joint effusion is seen.
IMPRESSION: Negative for fracture.

Small to moderate joint effusion.

Mild osteoarthritis.  Chondrocalcinosis also noted.

## 2021-01-26 DEATH — deceased
